# Patient Record
Sex: Female | Born: 1990 | Race: White | Hispanic: No | Marital: Single | Smoking: Former smoker
Health system: Southern US, Community
[De-identification: ages and names within clinical notes are randomized; demographics above are authoritative.]

## PROBLEM LIST (undated history)

## (undated) DIAGNOSIS — F329 Major depressive disorder, single episode, unspecified: Secondary | ICD-10-CM

## (undated) DIAGNOSIS — F32A Depression, unspecified: Secondary | ICD-10-CM

---

## 2013-09-02 ENCOUNTER — Encounter (HOSPITAL_COMMUNITY): Payer: Self-pay | Admitting: Emergency Medicine

## 2013-09-02 ENCOUNTER — Emergency Department (HOSPITAL_COMMUNITY): Payer: BC Managed Care – PPO

## 2013-09-02 ENCOUNTER — Emergency Department (HOSPITAL_COMMUNITY)
Admission: EM | Admit: 2013-09-02 | Discharge: 2013-09-02 | Disposition: A | Discharge status: Home / Self Care | Payer: BC Managed Care – PPO | Attending: Emergency Medicine | Admitting: Emergency Medicine

## 2013-09-02 DIAGNOSIS — Z87891 Personal history of nicotine dependence: Secondary | ICD-10-CM | POA: Insufficient documentation

## 2013-09-02 DIAGNOSIS — R0602 Shortness of breath: Secondary | ICD-10-CM | POA: Diagnosis present

## 2013-09-02 DIAGNOSIS — F419 Anxiety disorder, unspecified: Secondary | ICD-10-CM

## 2013-09-02 DIAGNOSIS — F411 Generalized anxiety disorder: Secondary | ICD-10-CM | POA: Diagnosis not present

## 2013-09-02 HISTORY — DX: Depression, unspecified: F32.A

## 2013-09-02 HISTORY — DX: Major depressive disorder, single episode, unspecified: F32.9

## 2013-09-02 NOTE — ED Provider Notes (Signed)
Medical screening examination/treatment/procedure(s) were performed by non-physician practitioner and as supervising physician I was immediately available for consultation/collaboration.   EKG Interpretation None        Monseratt Ledin L Lenea Bywater, MD 09/02/13 0629 

## 2013-09-02 NOTE — Discharge Instructions (Signed)
Your chest x-ray did not show any concerning findings. At this time your providers for your shortness of breath and symptoms were caused from anxiety. Please followup with your doctors for continued evaluation and treatment.    Panic Attacks Panic attacks are sudden, short feelings of great fear or discomfort. You may have them for no reason when you are relaxed, when you are uneasy (anxious), or when you are sleeping.  HOME CARE  Take all your medicines as told.  Check with your doctor before starting new medicines.  Keep all doctor visits. GET HELP IF:  You are not able to take your medicines as told.  Your symptoms do not get better.  Your symptoms get worse. GET HELP RIGHT AWAY IF:  Your attacks seem different than your normal attacks.  You have thoughts about hurting yourself or others.  You take panic attack medicine and you have a side effect. MAKE SURE YOU:  Understand these instructions.  Will watch your condition.  Will get help right away if you are not doing well or get worse. Document Released: 01/23/2010 Document Revised: 10/11/2012 Document Reviewed: 08/04/2012 [facility] Patient Information 2015 [place], [place]. This information is not intended to replace advice given to you by your health care provider. Make sure you discuss any questions you have with your health care provider.

## 2013-09-02 NOTE — ED Provider Notes (Signed)
CSN: [id number]     Arrival date & time 09/02/13  0148 History   First MD Initiated Contact with Patient 09/02/13 0206     Chief Complaint  Patient presents with  . Shortness of Breath   HPI  History provided by the patient and significant other. Patient is a 23 year old female with a history of anxiety and depression who presents with feeling shortness of breath. Patient admits to drinking a large amount of wine this evening was out at a bar and suddenly fell overwhelmed with heaviness around her chest and shortness of breath. She had rapid breathing with slight tingling and numbness to the nose. She also became very tearful and has been crying. She has never experienced anything like this before. She does report taking Wellbutrin for anxiety and depression for the past 4-5 months. She has never had alcohol while taking this medication and she questions whether or not this may be an interaction causing her symptoms. She denies any other aggravating or alleviating factors. No associated chest pain, nausea or vomiting.    Past Medical History  Diagnosis Date  . Depression    No past surgical history on file. No family history on file. History  Substance Use Topics  . Smoking status: Former Games developer  . Smokeless tobacco: Not on file  . Alcohol Use: Yes   OB History   Grav Para Term Preterm Abortions TAB SAB Ect Mult Living                 Review of Systems  Respiratory: Positive for shortness of breath.   Cardiovascular: Negative for chest pain.  All other systems reviewed and are negative.     Allergies  Review of patient's allergies indicates no known allergies.  Home Medications   Prior to Admission medications   Not on File   BP 116/78  Pulse 94  Temp(Src) 97.6 F (36.4 C) (Oral)  Resp 24  SpO2 100% Physical Exam  Nursing note and vitals reviewed. Constitutional: She is oriented to person, place, and time. She appears well-developed and well-nourished. No  distress.  HENT:  Head: Normocephalic.  Cardiovascular: Normal rate and regular rhythm.   Pulmonary/Chest: Effort normal and breath sounds normal. No respiratory distress. She has no wheezes. She has no rales.  No hyperventilation at this time. Speaking full sentences.  Abdominal: Soft.  Musculoskeletal: Normal range of motion. She exhibits no edema and no tenderness.  Neurological: She is alert and oriented to person, place, and time.  Skin: Skin is warm and dry. No rash noted.  Psychiatric: Her behavior is normal. Her mood appears anxious.  Tearful    ED Course  Procedures   COORDINATION OF CARE:  Nursing notes reviewed. Vital signs reviewed. Initial pt interview and examination performed.   Filed Vitals:   09/02/13 0153 09/02/13 0159  BP: 116/78   Pulse: 94   Temp:  97.6 F (36.4 C)  TempSrc: Oral Oral  Resp: 24   SpO2: 100%     2:20 AM-patient seen and evaluated. She appears anxious but is calming down. No tachypnea. Normal respirations and O2 sats. No signs of respiratory distress. Symptoms seem consistent with anxiety and panic attack. No chest pain. Lungs are clear.  Patient continues to be calm with no signs of respiratory distress. Chest x-ray unremarkable. Suspect anxiety and panic. She is stable at this time and may be discharged home.   Treatment plan initiated:Medications - No data to display    Imaging Review Dg Chest  2 View  09/02/2013   CLINICAL DATA:  Severe shortness of breath and anxiety.  EXAM: CHEST  2 VIEW  COMPARISON:  None.  FINDINGS: The heart size and mediastinal contours are within normal limits. Both lungs are clear. The visualized skeletal structures are unremarkable.  IMPRESSION: No active cardiopulmonary disease.   Electronically Signed   By: Burman Nieves M.D.   On: 09/02/2013 02:38     MDM   Final diagnoses:  Anxiety       Angus Seller, PA-C 09/02/13 [phone]

## 2013-09-02 NOTE — ED Notes (Signed)
Pt arrived to the ED with a complaint of shortness of breath.  Pt is on Wellbutrin and believes she is having a reaction to it.  Pt has a history of depression.  Pt is tearful and scared in triage

## 2015-06-26 IMAGING — CR DG CHEST 2V
2 series · 2 of 2 positions shown · non-contrast
Comparison: None.

CLINICAL DATA: Severe shortness of breath and anxiety.

EXAM:
CHEST  2 VIEW

[w chest pa]
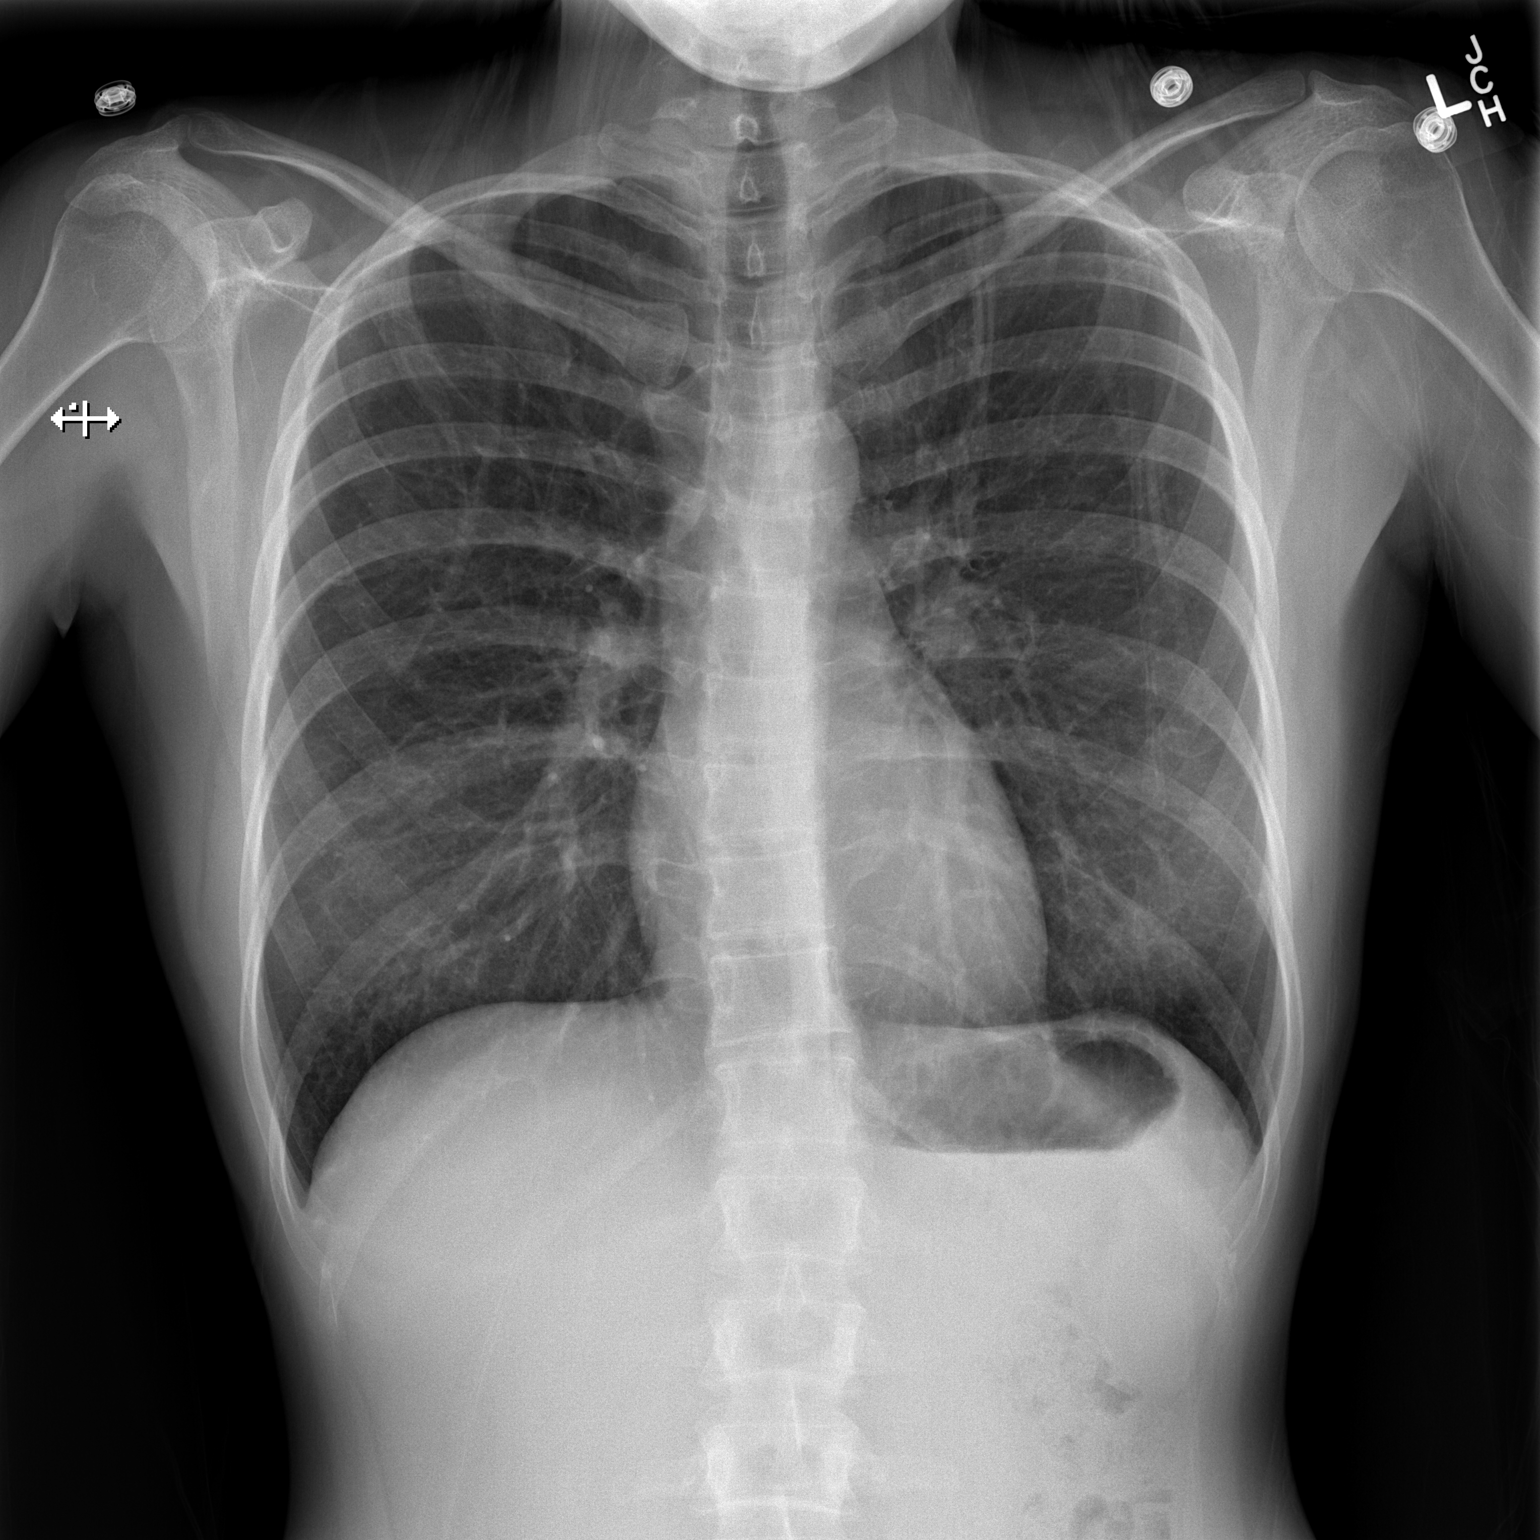

[w chest lat]
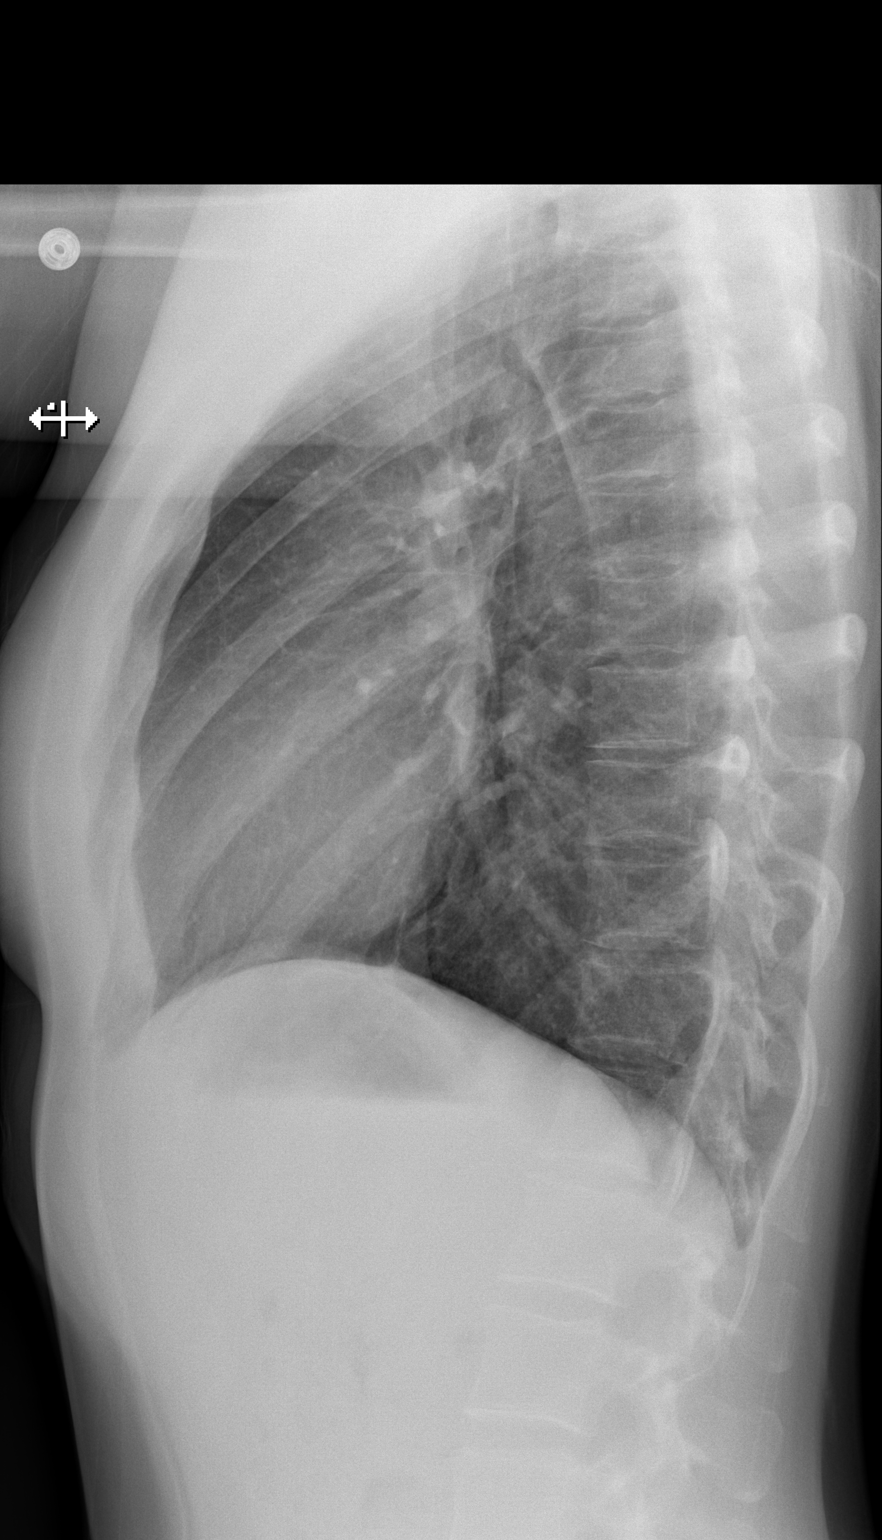

[2 of 2 positions shown; findings below may reference images not displayed]

FINDINGS: The heart size and mediastinal contours are within normal limits.
Both lungs are clear. The visualized skeletal structures are
unremarkable.
IMPRESSION: No active cardiopulmonary disease.
